# Patient Record
Sex: Female | Born: 1970 | Hispanic: No | Marital: Single | Smoking: Never smoker
Health system: Southern US, Community
[De-identification: ages and names within clinical notes are randomized; demographics above are authoritative.]

## PROBLEM LIST (undated history)

## (undated) DIAGNOSIS — I10 Essential (primary) hypertension: Secondary | ICD-10-CM

---

## 2014-08-09 ENCOUNTER — Emergency Department (INDEPENDENT_AMBULATORY_CARE_PROVIDER_SITE_OTHER)
Admission: EM | Admit: 2014-08-09 | Discharge: 2014-08-09 | Disposition: A | Discharge status: Home / Self Care | Payer: Self-pay | Source: Home / Self Care | Attending: Emergency Medicine | Admitting: Emergency Medicine

## 2014-08-09 ENCOUNTER — Encounter (HOSPITAL_COMMUNITY): Payer: Self-pay | Admitting: *Deleted

## 2014-08-09 DIAGNOSIS — N644 Mastodynia: Secondary | ICD-10-CM

## 2014-08-09 LAB — POCT PREGNANCY, URINE: Preg Test, Ur: NEGATIVE

## 2014-08-09 MED ORDER — IBUPROFEN 800 MG PO TABS
ORAL_TABLET | ORAL | Status: AC
  Filled 2014-08-09: qty 1

## 2014-08-09 MED ORDER — IBUPROFEN 800 MG PO TABS
800.0000 mg | ORAL_TABLET | Freq: Once | ORAL | Status: AC
  Administered 2014-08-09: 800 mg via ORAL

## 2014-08-09 NOTE — ED Notes (Signed)
Pt reports     Symptoms  Of     Pain  In  Both  Breasts           X  sev  Months     Pt  denys  Any  Drainage  From  Nipples  But  Reports  A  Sensation  That   She    Feels  Like  Pressure    Pt is  Sitting upright on  The  Exam table  Speaking in  Complete  sentances

## 2014-08-09 NOTE — Discharge Instructions (Signed)
Your exam is without worrisome finding. Advised using ibuprofen or tylenol as directed on packaging for discomfort and establishing care with local PCP for follow if symptoms persist. Ultimately patient would benefit from mammography as part of her routine medical care, however, her examination today does not suggest the need for urgent imaging.  Urine pregnancy test is negative

## 2014-08-09 NOTE — ED Provider Notes (Signed)
CSN: [id number]     Arrival date & time 08/09/14  1127 History   First MD Initiated Contact with Patient 08/09/14 1205     Chief Complaint  Patient presents with  . Breast Problem   (Consider location/radiation/quality/duration/timing/severity/associated sxs/prior Treatment) HPI Comments: Patient present for evaluation of a constant wandering burning sensation in both of her breasts over the past 2-3 months. Denies previous episodes. Has never had a mammogram. No changes in breast or skin appearance or nipple discharge. Has not tried taking any sort of medication for this issue. No reported injury. No SOB, chest pain, fever, dyspepsia. Nothing make symptoms better or worse. No rash.  Unemployed Nonsmoker LNMP: 07/19/2014 PCP: none  The history is provided by the patient.    History reviewed. No pertinent past medical history. Past Surgical History  Procedure Laterality Date  . Cesarean section     History reviewed. No pertinent family history. History  Substance Use Topics  . Smoking status: Never Smoker   . Smokeless tobacco: Not on file  . Alcohol Use: No   OB History    No data available     Review of Systems  All other systems reviewed and are negative.   Allergies  Review of patient's allergies indicates no known allergies.  Home Medications   Prior to Admission medications   Not on File   BP 137/71 mmHg  Pulse 70  Temp(Src) 99.1 F (37.3 C) (Oral)  Resp 18  SpO2 96%  LMP 07/19/2014 (Exact Date) Physical Exam  Constitutional: She is oriented to person, place, and time. She appears well-developed and well-nourished. No distress.  HENT:  Head: Normocephalic and atraumatic.  Cardiovascular: Normal rate, regular rhythm and normal heart sounds.   Pulmonary/Chest: Effort normal and breath sounds normal. Right breast exhibits inverted nipple and tenderness. Right breast exhibits no mass, no nipple discharge and no skin change. Left breast exhibits inverted nipple  and tenderness. Left breast exhibits no mass, no nipple discharge and no skin change. Breasts are symmetrical.    No axillary lymphadenopathy.   Genitourinary: There is breast tenderness. No breast swelling, discharge or bleeding.  Musculoskeletal: Normal range of motion.  Neurological: She is alert and oriented to person, place, and time.  Skin: Skin is warm and dry.  Psychiatric: She has a normal mood and affect. Her behavior is normal.  Nursing note and vitals reviewed.   ED Course  Procedures (including critical care time) Labs Review Labs Reviewed  POCT PREGNANCY, URINE    Imaging Review No results found.   MDM   1. Breast pain in female    Exam without worrisome finding. Advised using ibuprofen or tylenol as directed on packaging for discomfort and establishing care with local PCP for follow if symptoms persist. Ultimately patient would benefit from mammography as part of her routine medical care, however, her examination today does not suggest the need for urgent imaging.  UPT negative  Ria ClockJennifer Lee H Presson, [place]PA 08/09/14 1356

## 2018-10-06 ENCOUNTER — Telehealth: Payer: Self-pay | Admitting: Emergency Medicine

## 2018-10-06 ENCOUNTER — Other Ambulatory Visit: Payer: Self-pay

## 2018-10-06 ENCOUNTER — Encounter: Payer: Self-pay | Admitting: Emergency Medicine

## 2018-10-06 ENCOUNTER — Telehealth: Payer: Self-pay | Admitting: *Deleted

## 2018-10-06 NOTE — Telephone Encounter (Signed)
Called patient to triage for appointment.  Left message in mobile voice mail to call back. 

## 2018-10-06 NOTE — Telephone Encounter (Signed)
Called the patient a second time to triage for appointment. No answer, left message in mobile voice mail to call back.

## 2018-10-06 NOTE — Telephone Encounter (Signed)
10/06/2018 - PATIENT HAD A NEW PATIENT APPOINTMENT SCHEDULED WITH DR. Irving Shows ON Thursday (10/06/2018) AT 2:40pm. SHE WAS A NO SHOW. CYNTHIA SAID SHE NEEDS TO BE SCHEDULED FOR A COMPLETE PHYSICAL. I TRIED TO CALL AND MAKE APPOINTMENT BUT HAD TO LEAVE A MESSAGE ON HER VOICE MAIL TO RETURN OUR CALL. MBC

## 2018-12-13 ENCOUNTER — Encounter: Payer: Self-pay | Admitting: Emergency Medicine

## 2018-12-13 ENCOUNTER — Other Ambulatory Visit: Payer: Self-pay

## 2018-12-13 ENCOUNTER — Ambulatory Visit (INDEPENDENT_AMBULATORY_CARE_PROVIDER_SITE_OTHER): Payer: Self-pay | Admitting: Emergency Medicine

## 2018-12-13 VITALS — BP 150/82 | HR 79 | Temp 98.3°F | Resp 16 | Ht 61.0 in | Wt 186.6 lb

## 2018-12-13 DIAGNOSIS — M159 Polyosteoarthritis, unspecified: Secondary | ICD-10-CM

## 2018-12-13 MED ORDER — MELOXICAM 7.5 MG PO TABS: 7.5000 mg | ORAL_TABLET | Freq: Every day | ORAL | 3 refills | Status: AC

## 2018-12-13 NOTE — Progress Notes (Signed)
Evelyn Parker 48 y.o.   Chief Complaint  Patient presents with   Hand Pain    both since March   Knee Pain    both since March and swelling    HISTORY OF PRESENT ILLNESS: This is a 48 y.o. female complaining of pain to both knees and both hands for the past several weeks.  Denies injuries.  No other significant symptoms.  HPI   Prior to Admission medications   Medication Sig Start Date End Date Taking? Authorizing Provider  OVER THE COUNTER MEDICATION 2 (two) times a day.   Yes [provider]    No Known Allergies  There are no active problems to display for this patient.   History reviewed. No pertinent past medical history.  Past Surgical History:  Procedure Laterality Date   CESAREAN SECTION      Social History   Socioeconomic History   Marital status: Single    Spouse name: Not on file   Number of children: Not on file   Years of education: Not on file   Highest education level: Not on file  Occupational History   Not on file  Social Needs   Financial resource strain: Not on file   Food insecurity    Worry: Not on file    Inability: Not on file   Transportation needs    Medical: Not on file    Non-medical: Not on file  Tobacco Use   Smoking status: Never Smoker   Smokeless tobacco: Never Used  Substance and Sexual Activity   Alcohol use: No   Drug use: Never   Sexual activity: Not on file  Lifestyle   Physical activity    Days per week: Not on file    Minutes per session: Not on file   Stress: Not on file  Relationships   Social connections    Talks on phone: Not on file    Gets together: Not on file    Attends religious service: Not on file    Active member of club or organization: Not on file    Attends meetings of clubs or organizations: Not on file    Relationship status: Not on file   Intimate partner violence    Fear of current or ex partner: Not on file    Emotionally abused: Not on file   Physically abused: Not on file    Forced sexual activity: Not on file  Other Topics Concern   Not on file  Social History Narrative   Not on file    History reviewed. No pertinent family history.   Review of Systems  Constitutional: Negative.  Negative for chills and fever.  HENT: Negative.  Negative for sore throat.   Respiratory: Negative.  Negative for cough and shortness of breath.   Cardiovascular: Negative.  Negative for chest pain and palpitations.  Gastrointestinal: Negative.  Negative for abdominal pain, nausea and vomiting.  Genitourinary: Negative.   Musculoskeletal: Positive for joint pain.  Skin: Negative.  Negative for rash.  Neurological: Negative for dizziness, sensory change, focal weakness and headaches.  All other systems reviewed and are negative.  Vitals:   12/13/18 1659  BP: (!) 150/82  Pulse: 79  Resp: 16  Temp: 98.3 F (36.8 C)  SpO2: 95%     Physical Exam Vitals signs reviewed.  Constitutional:      Appearance: Normal appearance.  HENT:     Head: Normocephalic and atraumatic.  Eyes:     Extraocular Movements: Extraocular  movements intact.     Pupils: Pupils are equal, round, and reactive to light.  Neck:     Musculoskeletal: Normal range of motion and neck supple.  Cardiovascular:     Rate and Rhythm: Normal rate and regular rhythm.     Heart sounds: Normal heart sounds.  Pulmonary:     Effort: Pulmonary effort is normal.     Breath sounds: Normal breath sounds.  Musculoskeletal: Normal range of motion.        General: No deformity.     Right lower leg: No edema.     Left lower leg: No edema.     Comments: Knees: No bruising or erythema.  Mild swelling.  Painful range of motion.  Stable in flexion and extension. Hands: No bruising or erythema.  Mild swelling of finger joints.  Painful range of motion.  Skin:    General: Skin is warm and dry.     Capillary Refill: Capillary refill takes less than 2 seconds.  Neurological:      General: No focal deficit present.     Mental Status: She is alert and oriented to person, place, and time.  Psychiatric:        Mood and Affect: Mood normal.        Behavior: Behavior normal.      ASSESSMENT & PLAN: Evelyn Parker was seen today for hand pain and knee pain.  Diagnoses and all orders for this visit:  Osteoarthritis of multiple joints, unspecified osteoarthritis type -     meloxicam (MOBIC) 7.5 MG tablet; Take 1 tablet (7.5 mg total) by mouth daily.    Patient Instructions       Artrosis Osteoarthritis  La artrosis es un tipo de artritis que afecta el tejido que cubre los extremos de los huesos en las articulaciones (cartlago). El cartlago acta como amortiguador AGCO Corporationentre los huesos y los ayuda a moverse con suavidad. La artrosis se produce cuando el cartlago de las articulaciones se gasta. A veces, la artrosis se denomina artritis "por uso y desgaste". La artrosis es la forma ms frecuente de artritis. A menudo, afecta a las Smith Internationalpersonas mayores. Es una enfermedad que empeora con el tiempo (una enfermedad progresiva). Esta enfermedad afecta con ms frecuencia las articulaciones de:  Los dedos de Washington Mutuallas manos.  Los dedos Avayade los pies.  La cadera.  Las rodillas.  La columna vertebral, incluido el cuello y la zona lumbar. Cules son las causas? El desgaste del cartlago que cubre los extremos de los huesos relacionado con la edad, causa esta afeccin. Qu incrementa el riesgo? Los siguientes factores pueden hacer que usted sea ms propenso a Warehouse managertener esta afeccin:  Edad avanzada.  Tener exceso de Fairhavenpeso u obesidad.  Uso excesivo de las articulaciones, como en el caso de los Westportatletas.  Lesin pasada de Risk analystuna articulacin.  Ciruga pasada en una articulacin.  Antecedentes familiares de artrosis. Cules son los signos o los sntomas? Los principales sntomas de esta enfermedad son dolor, hinchazn y Geophysical data processorrigidez en la articulacin. Con el tiempo, la articulacin puede  perder su forma. Pequeos trozos de Dow Chemicalhueso o Patent examinercartlago se pueden desprender y flotar dentro de la articulacin, lo cual puede causar ms dolor y Clinical cytogeneticistdao en la articulacin. Pueden formarse pequeos depsitos de hueso (ostefitos) en los extremos de Nurse, learning disabilityla articulacin. Otros sntomas pueden incluir lo siguiente:  Una sensacin de chirrido o raspado dentro de la articulacin al moverla.  Sonidos de chasquido o crujido al Clorox Companymoverse. Los sntomas pueden afectar una o ms articulaciones. La  artrosis en Risk analystuna articulacin principal, como la rodilla o la cadera, puede causar dolor al caminar o al realizar ejercicio. Si tiene artrosis Jabil Circuiten las manos, es posible que no pueda agarrar objetos, torcer la mano o controlar pequeos movimientos de las manos y los dedos (motricidad fina). Cmo se diagnostica? Esta afeccin se puede diagnosticar en funcin de lo siguiente:  Sus antecedentes mdicos.  Un examen fsico.  Sus sntomas.  Radiografas de la(s) articulacin(es) afectada(s).  Anlisis de sangre para descartar otros tipos de artritis. Cmo se trata? No hay cura para esta enfermedad, pero el tratamiento puede ayudar a Human resources officercontrolar el dolor y Scientist, clinical (histocompatibility and immunogenetics)mejorar el funcionamiento de Nurse, learning disabilityla articulacin. Los planes de tratamiento pueden incluir:  Un programa de ejercicio indicado que permita el descanso y el alivio de la articulacin. Puede trabajar con un fisioterapeuta.  Un plan de control del peso.  Tcnicas de Occidental Petroleumalivio del dolor, como: ? Aplicacin de calor y fro en la articulacin. ? Impulsos elctricos aplicados a las terminaciones nerviosas que se encuentran debajo de la piel (estimulacin nerviosa elctrica transcutnea o TENS). ? Masajes. ? Ciertos suplementos nutricionales.  Antiinflamatorios no esteroideos (AINE) o medicamentos recetados para ayudar a Engineer, materialsaliviar el dolor.  Medicamentos para ayudar a Engineer, materialsaliviar el dolor y la inflamacin (corticoesteroides). Estos se pueden administrar por boca (va oral) o mediante una  inyeccin.  Dispositivos de [place] Vincent and the Grenadinesayuda, como un dispositivo ortopdico, una frula, un guante especial o un bastn.  Ciruga, como: ? Carolin SicksUna osteotoma. Se hace para volver a posicionar los TransMontaignehuesos y Engineer, materialsaliviar el dolor o para Oceanographerretirar los trozos sueltos de hueso y TEFL teachercartlago. ? Ciruga de reemplazo articular. Es posible que necesite esta ciruga si tiene una artrosis muy grave (avanzada). Siga estas indicaciones en su casa: Actividad  Descanse las articulaciones afectadas segn las indicaciones del mdico.  No conduzca ni use maquinaria pesada mientras toma analgsicos recetados.  Haga ejercicio segn le indiquen. Es posible que el mdico o el fisioterapeuta le recomienden tipos especficos de ejercicio, tales como: ? Ejercicios de fortalecimiento. Se realizan para fortalecer los msculos que sostienen las articulaciones afectadas por la artritis. Pueden realizarse con peso o con bandas para agregar resistencia. ? Ejercicios aerbicos. Son ejercicios, como caminar a paso ligero o hacer gimnasia Cook Islandsaerbica acutica, que aumentan la actividad del corazn. ? Actividades de amplitud de movimientos. Facilitan el movimiento de las articulaciones. ? Ejercicios de equilibrio y Russian Federationagilidad. Control del dolor, la rigidez y la hinchazn      Si se lo indican, aplique calor en la zona afectada con la frecuencia que le haya indicado el mdico. Use la fuente de calor que el mdico le recomiende, como una compresa de calor hmedo o una almohadilla trmica. ? Si tiene un dispositivo de ayuda que se puede quitar, quteselo segn lo indicado por su mdico. ? Coloque una toalla entre la piel y la fuente de Airline pilotcalor. Si el mdico le indica que no se quite el dispositivo de USAAayuda mientras se Tax inspectoraplica calor, coloque una toalla entre el dispositivo de Saint Vincent and the Grenadinesayuda y la fuente de Airline pilotcalor. ? Aplique el calor durante 20 a 30minutos. ? Retire la fuente de calor si la piel se pone de color rojo brillante. Esto es muy importante si no puede Chief of Staffsentir  dolor, calor o fro. Puede correr un riesgo mayor de sufrir quemaduras.  Si se lo indican, aplique hielo sobre la articulacin afectada: ? Si tiene un dispositivo de ayuda que se puede quitar, quteselo segn lo indicado por su mdico. ? Ponga el hielo en Lucile Shuttersuna bolsa  plstica. ? Coloque una FirstEnergy Corptoalla entre la piel y la bolsa de hielo. Si el mdico le indica que no se quite el dispositivo de USAAayuda mientras se aplica hielo, coloque una toalla entre el dispositivo de [place] Vincent and the Grenadinesayuda y la bolsa de hielo. ? Coloque el hielo durante 20minutos, 2 a 3veces por da. Instrucciones generales  Baxter Internationalome los medicamentos de venta libre y los recetados solamente como se lo haya indicado el mdico.  Mantenga un peso saludable. Siga las instrucciones de su mdico con respecto al control de su peso. Estas pueden incluir restricciones en la dieta.  No consuma ningn producto que contenga nicotina o tabaco, como cigarrillos y Administrator, Civil Servicecigarrillos electrnicos. Estos pueden retrasar la consolidacin del Twin Fallshueso. Si necesita ayuda para dejar de fumar, consulte al American Expressmdico.  Use los dispositivos de American Expressayuda como se lo haya indicado el mdico.  Concurra a todas las visitas de seguimiento como se lo haya indicado el mdico. Esto es importante. Dnde encontrar ms informacin:  Training and development officernstituto Nacional de Reumatismo Articular y Event organisernfermedades Musculoesquelticas y ArboriculturistDermatolgicas [facility] Hlthcare(National Institute of Arthritis and Musculoskeletal and Skin Diseases): www.niams.http://www.myers.net/nih.gov  Instituto Lockheed Martinacional sobre el Envejecimiento (General Millsational Institute on Aging): https://walker.com/www.nia.nih.gov  Instituto Estadounidense de Advice workereumatologa (American College of Rheumatology): www.rheumatology.org Comunquese con un mdico si:  Su piel se pone roja.  Presenta una erupcin cutnea.  Siente un dolor que [place] Rapidsempeora.  Tiene fiebre y siente dolor en la articulacin o el msculo. Solicite ayuda de inmediato si:  Northeast UtilitiesPierde mucho peso.  Pierde el apetito repentinamente.  Transpira durante la  noche. Resumen  La artrosis es una clase de artritis que afecta el tejido que cubre los extremos de los huesos en las articulaciones (cartlago).  El desgaste del cartlago que cubre los extremos de los huesos relacionado con la edad, causa esta afeccin.  Los sntomas principales de esta enfermedad son dolor, hinchazn y Geophysical data processorrigidez en la articulacin.  No hay cura para esta enfermedad, pero el tratamiento puede ayudar a Human resources officercontrolar el dolor y Scientist, clinical (histocompatibility and immunogenetics)mejorar el funcionamiento de Nurse, learning disabilityla articulacin. Esta informacin no tiene Theme park managercomo fin reemplazar el consejo del mdico. Asegrese de hacerle al mdico cualquier pregunta que tenga. Document Released: 03/04/2005 Document Revised: 08/23/2017 Document Reviewed: 01/30/2013 Elsevier Patient Education  [employer]2020 Elsevier Inc.  If you have lab work done today you will be contacted with your lab results within the next 2 weeks.  If you have not heard from us then please contact us. The fastest way to get your results is to register for My Chart.   IF you received an x-ray today, you will receive an invoice from [facility]. Please contact [facility] at [phone] with questions or concerns regarding your invoice.   IF you received labwork today, you will receive an invoice from [facility]. Please contact [facility] at [phone] with questions or concerns regarding your invoice.   Our billing staff will not be able to assist you with questions regarding bills from these companies.  You will be contacted with the lab results as soon as they are available. The fastest way to get your results is to activate your My Chart account. Instructions are located on the last page of this paperwork. If you have not heard from us regarding the results in 2 weeks, please contact this office.         Edwina BarthMiguel Zeba Luby, MD [facility]

## 2018-12-13 NOTE — Patient Instructions (Addendum)
Artrosis Osteoarthritis  La artrosis es un tipo de artritis que afecta el tejido que cubre los extremos de los huesos en las articulaciones (cartlago). El cartlago acta como amortiguador AGCO Corporationentre los huesos y los ayuda a moverse con suavidad. La artrosis se produce cuando el cartlago de las articulaciones se gasta. A veces, la artrosis se denomina artritis "por uso y desgaste". La artrosis es la forma ms frecuente de artritis. A menudo, afecta a las Smith Internationalpersonas mayores. Es una enfermedad que empeora con el tiempo (una enfermedad progresiva). Esta enfermedad afecta con ms frecuencia las articulaciones de:  Los dedos de Washington Mutuallas manos.  Los dedos Avayade los pies.  La cadera.  Las rodillas.  La columna vertebral, incluido el cuello y la zona lumbar. Cules son las causas? El desgaste del cartlago que cubre los extremos de los huesos relacionado con la edad, causa esta afeccin. Qu incrementa el riesgo? Los siguientes factores pueden hacer que usted sea ms propenso a Warehouse managertener esta afeccin:  Edad avanzada.  Tener exceso de Glendorapeso u obesidad.  Uso excesivo de las articulaciones, como en el caso de los Eustisatletas.  Lesin pasada de Risk analystuna articulacin.  Ciruga pasada en una articulacin.  Antecedentes familiares de artrosis. Cules son los signos o los sntomas? Los principales sntomas de esta enfermedad son dolor, hinchazn y Geophysical data processorrigidez en la articulacin. Con el tiempo, la articulacin puede perder su forma. Pequeos trozos de Dow Chemicalhueso o Patent examinercartlago se pueden desprender y flotar dentro de la articulacin, lo cual puede causar ms dolor y Clinical cytogeneticistdao en la articulacin. Pueden formarse pequeos depsitos de hueso (ostefitos) en los extremos de Nurse, learning disabilityla articulacin. Otros sntomas pueden incluir lo siguiente:  Una sensacin de chirrido o raspado dentro de la articulacin al moverla.  Sonidos de chasquido o crujido al Clorox Companymoverse. Los sntomas pueden afectar una o ms articulaciones. La artrosis en una  articulacin principal, como la rodilla o la cadera, puede causar dolor al caminar o al realizar ejercicio. Si tiene artrosis Jabil Circuiten las manos, es posible que no pueda agarrar objetos, torcer la mano o controlar pequeos movimientos de las manos y los dedos (motricidad fina). Cmo se diagnostica? Esta afeccin se puede diagnosticar en funcin de lo siguiente:  Sus antecedentes mdicos.  Un examen fsico.  Sus sntomas.  Radiografas de la(s) articulacin(es) afectada(s).  Anlisis de sangre para descartar otros tipos de artritis. Cmo se trata? No hay cura para esta enfermedad, pero el tratamiento puede ayudar a Human resources officercontrolar el dolor y Scientist, clinical (histocompatibility and immunogenetics)mejorar el funcionamiento de Nurse, learning disabilityla articulacin. Los planes de tratamiento pueden incluir:  Un programa de ejercicio indicado que permita el descanso y el alivio de la articulacin. Puede trabajar con un fisioterapeuta.  Un plan de control del peso.  Tcnicas de Occidental Petroleumalivio del dolor, como: ? Aplicacin de calor y fro en la articulacin. ? Impulsos elctricos aplicados a las terminaciones nerviosas que se encuentran debajo de la piel (estimulacin nerviosa elctrica transcutnea o TENS). ? Masajes. ? Ciertos suplementos nutricionales.  Antiinflamatorios no esteroideos (AINE) o medicamentos recetados para ayudar a Engineer, materialsaliviar el dolor.  Medicamentos para ayudar a Engineer, materialsaliviar el dolor y la inflamacin (corticoesteroides). Estos se pueden administrar por boca (va oral) o mediante una inyeccin.  Dispositivos de [place] Vincent and the Grenadinesayuda, como un dispositivo ortopdico, una frula, un guante especial o un bastn.  Ciruga, como: ? Carolin SicksUna osteotoma. Se hace para volver a posicionar los TransMontaignehuesos y Engineer, materialsaliviar el dolor o para Oceanographerretirar los trozos sueltos de hueso y TEFL teachercartlago. ? Ciruga de reemplazo articular. Es posible que necesite Saint Vincent and the Grenadinesesta ciruga  si tiene una artrosis muy grave (avanzada). Siga estas indicaciones en su casa: Actividad  Descanse las articulaciones afectadas segn las indicaciones del  mdico.  No conduzca ni use maquinaria pesada mientras toma analgsicos recetados.  Haga ejercicio segn le indiquen. Es posible que el mdico o el fisioterapeuta le recomienden tipos especficos de ejercicio, tales como: ? Ejercicios de fortalecimiento. Se realizan para fortalecer los msculos que sostienen las articulaciones afectadas por la artritis. Pueden realizarse con peso o con bandas para agregar resistencia. ? Ejercicios aerbicos. Son ejercicios, como caminar a paso ligero o hacer gimnasia Cook Islandsaerbica acutica, que aumentan la actividad del corazn. ? Actividades de amplitud de movimientos. Facilitan el movimiento de las articulaciones. ? Ejercicios de equilibrio y Russian Federationagilidad. Control del dolor, la rigidez y la hinchazn      Si se lo indican, aplique calor en la zona afectada con la frecuencia que le haya indicado el mdico. Use la fuente de calor que el mdico le recomiende, como una compresa de calor hmedo o una almohadilla trmica. ? Si tiene un dispositivo de ayuda que se puede quitar, quteselo segn lo indicado por su mdico. ? Coloque una toalla entre la piel y la fuente de Airline pilotcalor. Si el mdico le indica que no se quite el dispositivo de USAAayuda mientras se Tax inspectoraplica calor, coloque una toalla entre el dispositivo de [place] Vincent and the Grenadinesayuda y la fuente de Airline pilotcalor. ? Aplique el calor durante 20 a 30minutos. ? Retire la fuente de calor si la piel se pone de color rojo brillante. Esto es muy importante si no puede Financial risk analystsentir dolor, calor o fro. Puede correr un riesgo mayor de sufrir quemaduras.  Si se lo indican, aplique hielo sobre la articulacin afectada: ? Si tiene un dispositivo de ayuda que se puede quitar, quteselo segn lo indicado por su mdico. ? Ponga el hielo en una bolsa plstica. ? Coloque una FirstEnergy Corptoalla entre la piel y la bolsa de hielo. Si el mdico le indica que no se quite el dispositivo de USAAayuda mientras se aplica hielo, coloque una toalla entre el dispositivo de [place] Vincent and the Grenadinesayuda y la bolsa de  hielo. ? Coloque el hielo durante 20minutos, 2 a 3veces por da. Instrucciones generales  Baxter Internationalome los medicamentos de venta libre y los recetados solamente como se lo haya indicado el mdico.  Mantenga un peso saludable. Siga las instrucciones de su mdico con respecto al control de su peso. Estas pueden incluir restricciones en la dieta.  No consuma ningn producto que contenga nicotina o tabaco, como cigarrillos y Administrator, Civil Servicecigarrillos electrnicos. Estos pueden retrasar la consolidacin del Uraniahueso. Si necesita ayuda para dejar de fumar, consulte al American Expressmdico.  Use los dispositivos de American Expressayuda como se lo haya indicado el mdico.  Concurra a todas las visitas de seguimiento como se lo haya indicado el mdico. Esto es importante. Dnde encontrar ms informacin:  Training and development officernstituto Nacional de Reumatismo Articular y Event organisernfermedades Musculoesquelticas y ArboriculturistDermatolgicas [facility](National Institute of Arthritis and Musculoskeletal and Skin Diseases): www.niams.http://www.myers.net/nih.gov  Instituto Lockheed Martinacional sobre el Envejecimiento (General Millsational Institute on Aging): https://walker.com/www.nia.nih.gov  Instituto Estadounidense de Advice workereumatologa (American College of Rheumatology): www.rheumatology.org Comunquese con un mdico si:  Su piel se pone roja.  Presenta una erupcin cutnea.  Siente un dolor que Fenwickempeora.  Tiene fiebre y siente dolor en la articulacin o el msculo. Solicite ayuda de inmediato si:  Northeast UtilitiesPierde mucho peso.  Pierde el apetito repentinamente.  Transpira durante la noche. Resumen  La artrosis es una clase de artritis que afecta el tejido que cubre los extremos de los Lucent Technologieshuesos en las  articulaciones (cartlago).  El desgaste del cartlago que cubre los extremos de los huesos relacionado con la edad, causa esta afeccin.  Los sntomas principales de esta enfermedad son dolor, hinchazn y Advertising account executive.  No hay cura para esta enfermedad, pero el tratamiento puede ayudar a Financial controller y Teacher, English as a foreign language el funcionamiento de Museum/gallery exhibitions officer. Esta informacin no tiene Marine scientist el consejo del mdico. Asegrese de hacerle al mdico cualquier pregunta que tenga. Document Released: 03/04/2005 Document Revised: 08/23/2017 Document Reviewed: 01/30/2013 Elsevier Patient Education  [employer].  If you have lab work done today you will be contacted with your lab results within the next 2 weeks.  If you have not heard from [place] then please contact us. The fastest way to get your results is to register for My Chart.   IF you received an x-ray today, you will receive an invoice from [facility]. Please contact [facility] at [phone] with questions or concerns regarding your invoice.   IF you received labwork today, you will receive an invoice from [place]. Please contact [facility] at [phone] with questions or concerns regarding your invoice.   Our billing staff will not be able to assist you with questions regarding bills from these companies.  You will be contacted with the lab results as soon as they are available. The fastest way to get your results is to activate your My Chart account. Instructions are located on the last page of this paperwork. If you have not heard from [place] regarding the results in 2 weeks, please contact this office.

## 2020-02-24 ENCOUNTER — Emergency Department (HOSPITAL_BASED_OUTPATIENT_CLINIC_OR_DEPARTMENT_OTHER): Payer: Self-pay

## 2020-02-24 ENCOUNTER — Encounter (HOSPITAL_BASED_OUTPATIENT_CLINIC_OR_DEPARTMENT_OTHER): Payer: Self-pay | Admitting: Emergency Medicine

## 2020-02-24 ENCOUNTER — Emergency Department (HOSPITAL_BASED_OUTPATIENT_CLINIC_OR_DEPARTMENT_OTHER)
Admission: EM | Admit: 2020-02-24 | Discharge: 2020-02-24 | Disposition: A | Discharge status: Home / Self Care | Payer: Self-pay | Attending: Emergency Medicine | Admitting: Emergency Medicine

## 2020-02-24 ENCOUNTER — Other Ambulatory Visit: Payer: Self-pay

## 2020-02-24 DIAGNOSIS — I10 Essential (primary) hypertension: Secondary | ICD-10-CM | POA: Insufficient documentation

## 2020-02-24 DIAGNOSIS — R42 Dizziness and giddiness: Secondary | ICD-10-CM | POA: Insufficient documentation

## 2020-02-24 DIAGNOSIS — R110 Nausea: Secondary | ICD-10-CM | POA: Insufficient documentation

## 2020-02-24 DIAGNOSIS — R55 Syncope and collapse: Secondary | ICD-10-CM | POA: Insufficient documentation

## 2020-02-24 DIAGNOSIS — R072 Precordial pain: Secondary | ICD-10-CM | POA: Insufficient documentation

## 2020-02-24 DIAGNOSIS — K760 Fatty (change of) liver, not elsewhere classified: Secondary | ICD-10-CM | POA: Insufficient documentation

## 2020-02-24 DIAGNOSIS — E876 Hypokalemia: Secondary | ICD-10-CM | POA: Insufficient documentation

## 2020-02-24 HISTORY — DX: Essential (primary) hypertension: I10

## 2020-02-24 LAB — CBC
HCT: 38.1 % (ref 36.0–46.0)
Hemoglobin: 12.3 g/dL (ref 12.0–15.0)
MCH: 28.3 pg (ref 26.0–34.0)
MCHC: 32.3 g/dL (ref 30.0–36.0)
MCV: 87.8 fL (ref 80.0–100.0)
Platelets: 280 10*3/uL (ref 150–400)
RBC: 4.34 MIL/uL (ref 3.87–5.11)
RDW: 12.6 % (ref 11.5–15.5)
WBC: 9.6 10*3/uL (ref 4.0–10.5)
nRBC: 0 % (ref 0.0–0.2)

## 2020-02-24 LAB — BASIC METABOLIC PANEL
Anion gap: 11 (ref 5–15)
BUN: 14 mg/dL (ref 6–20)
CO2: 23 mmol/L (ref 22–32)
Calcium: 8.5 mg/dL — ABNORMAL LOW (ref 8.9–10.3)
Chloride: 102 mmol/L (ref 98–111)
Creatinine, Ser: 0.72 mg/dL (ref 0.44–1.00)
GFR calc Af Amer: >60 mL/min (ref >60)
GFR calc non Af Amer: >60 mL/min (ref >60)
Glucose, Bld: 118 mg/dL — ABNORMAL HIGH (ref 70–99)
Potassium: 3.1 mmol/L — ABNORMAL LOW (ref 3.5–5.1)
Sodium: 136 mmol/L (ref 135–145)

## 2020-02-24 LAB — TROPONIN I (HIGH SENSITIVITY)
Troponin I (High Sensitivity): 2 ng/L (ref <18)
Troponin I (High Sensitivity): 3 ng/L (ref <18)

## 2020-02-24 LAB — D-DIMER, QUANTITATIVE: D-Dimer, Quant: 0.52 ug/mL-FEU — ABNORMAL HIGH (ref 0.00–0.50)

## 2020-02-24 LAB — PREGNANCY, URINE: Preg Test, Ur: NEGATIVE

## 2020-02-24 MED ORDER — POTASSIUM CHLORIDE CRYS ER 20 MEQ PO TBCR
40.0000 meq | EXTENDED_RELEASE_TABLET | Freq: Once | ORAL | Status: AC
  Administered 2020-02-24: 40 meq via ORAL
  Filled 2020-02-24: qty 2

## 2020-02-24 MED ORDER — IOHEXOL 350 MG/ML SOLN
100.0000 mL | Freq: Once | INTRAVENOUS | Status: AC | PRN
  Administered 2020-02-24: 100 mL via INTRAVENOUS

## 2020-02-24 MED ORDER — FAMOTIDINE 20 MG PO TABS: 20.0000 mg | ORAL_TABLET | Freq: Two times a day (BID) | ORAL | 0 refills | Status: AC

## 2020-02-24 NOTE — ED Triage Notes (Signed)
Pt with her daughter who is translating. Pt c/o R side chest pain since 4pm. Also reports she had a syncopal episode.

## 2020-02-24 NOTE — ED Notes (Signed)
Presents with sharp non-radiating chest pain, had nausea and vomiting when pain occurred immediately after eating. Placed on cont cardiac monitoring with cont POX monitoirng and int NBP assessments, ED PA at bedside interviewing client at this time. Has been told she has HTN, no diabetes, non-smoker. States chest pain is now going away with lying down. Denies diarrhea. Orders rec

## 2020-02-24 NOTE — ED Notes (Signed)
Patient transported to CT, via stretcher, SR x 2 up

## 2020-02-24 NOTE — ED Notes (Signed)
Patient transported to X-ray via w/c 

## 2020-02-24 NOTE — Discharge Instructions (Signed)
Please read and follow all provided instructions.  Your diagnoses today include:  1. Precordial chest pain   2. Hypokalemia   3. Hepatic steatosis     Tests performed today include:  An EKG of your heart  A chest x-ray  Cardiac enzymes - a blood test for heart muscle damage  Blood counts and electrolytes  CT scan to look for blood clots - no problems seen in the chest or lungs, you have build-up of fat that we can see in the liver, please let your doctor know about this  Vital signs. See below for your results today.   Medications prescribed:   Pepcid (famotidine) - antihistamine  You can find this medication over-the-counter.   DO NOT exceed:   20mg  Pepcid every 12 hours  Take any prescribed medications only as directed.  Follow-up instructions: Please follow-up with your primary care provider in the next three days for recheck.   Return instructions:  SEEK IMMEDIATE MEDICAL ATTENTION IF:  You have severe chest pain, especially if the pain is crushing or pressure-like and spreads to the arms, back, neck, or jaw, or if you have sweating, nausea (feeling sick to your stomach), or shortness of breath. THIS IS AN EMERGENCY. Don't wait to see if the pain will go away. Get medical help at once. Call 911 or 0 (operator). DO NOT drive yourself to the hospital.   Your chest pain gets worse and does not go away with rest.   You have an attack of chest pain lasting longer than usual, despite rest and treatment with the medications your caregiver has prescribed.   You wake from sleep with chest pain or shortness of breath.  You feel dizzy or faint.  You have chest pain not typical of your usual pain for which you originally saw your caregiver.   You have any other emergent concerns regarding your health.  Additional Information: Chest pain comes from many different causes. Your caregiver has diagnosed you as having chest pain that is not specific for one problem, but does  not require admission.  You are at low risk for an acute heart condition or other serious illness.   Your vital signs today were: BP 139/78 (BP Location: Left Arm)   Pulse 66   Temp 98.2 F (36.8 C) (Oral)   Resp (!) 22   Ht 5\' 1"  (1.549 m)   Wt 77.1 kg   LMP 02/24/2020 (Exact Date)   SpO2 98%   BMI 32.12 kg/m  If your blood pressure (BP) was elevated above 135/85 this visit, please have this repeated by your doctor within one month. --------------

## 2020-02-24 NOTE — ED Provider Notes (Signed)
[facility] EMERGENCY DEPARTMENT Provider Note   CSN: [id number] Arrival date & time: 02/24/20  1755     History Chief Complaint  Patient presents with  . Chest Pain    Evelyn Parker is a 49 y.o. female.  Patient with history of hypertension and high cholesterol presents the emergency department for acute onset of chest pain at approximately 4 PM.  Patient was eating and developed sharp pain in middle of her chest with associated nausea and near vomiting.  Pain was severe at onset.  It did not radiate.  She had a near syncopal episode and has been "zoning in and out" per patient's daughter who is at bedside interpreting.  No reported fevers, cough, shortness of breath.  No preceding vomiting or diarrhea.  No treatments prior to arrival.  Patient denies history of diabetes, tobacco abuse, family history of heart attacks.  Patient denies any current lower extremity swelling. Patient denies risk factors for pulmonary embolism including: unilateral leg swelling, history of DVT/PE/other blood clots, use of exogenous hormones, recent immobilizations, recent surgery, recent travel (>4hr segment), malignancy, hemoptysis.         Past Medical History:  Diagnosis Date  . Hypertension     There are no problems to display for this patient.   Past Surgical History:  Procedure Laterality Date  . CESAREAN SECTION       OB History   No obstetric history on file.     No family history on file.  Social History   Tobacco Use  . Smoking status: Never Smoker  . Smokeless tobacco: Never Used  Substance Use Topics  . Alcohol use: No  . Drug use: Never    Home Medications Prior to Admission medications   Medication Sig Start Date End Date Taking? Authorizing Provider  meloxicam (MOBIC) 7.5 MG tablet Take 1 tablet (7.5 mg total) by mouth daily. 12/13/18   Georgina Quint, MD  OVER THE COUNTER MEDICATION 2 (two) times a day.    [provider]     Allergies    Penicillins  Review of Systems   Review of Systems  Constitutional: Negative for diaphoresis and fever.  Eyes: Negative for redness.  Respiratory: Negative for cough and shortness of breath.   Cardiovascular: Positive for chest pain. Negative for palpitations and leg swelling.  Gastrointestinal: Positive for nausea. Negative for abdominal pain and vomiting.  Genitourinary: Negative for dysuria.  Musculoskeletal: Negative for back pain and neck pain.  Skin: Negative for rash.  Neurological: Positive for dizziness, syncope and light-headedness.  Psychiatric/Behavioral: The patient is not nervous/anxious.     Physical Exam Updated Vital Signs BP 140/72 (BP Location: Left Arm)   Pulse 78   Temp 98.2 F (36.8 C) (Oral)   Resp (!) 22   Ht 5\' 1"  (1.549 m)   Wt 77.1 kg   SpO2 98%   BMI 32.12 kg/m   Physical Exam Vitals and nursing note reviewed.  Constitutional:      Appearance: She is well-developed. She is not diaphoretic.  HENT:     Head: Normocephalic and atraumatic.     Mouth/Throat:     Mouth: Mucous membranes are not dry.  Eyes:     Conjunctiva/sclera: Conjunctivae normal.  Neck:     Vascular: Normal carotid pulses. No carotid bruit or JVD.     Trachea: Trachea normal. No tracheal deviation.  Cardiovascular:     Rate and Rhythm: Normal rate and regular rhythm.     Pulses:  No decreased pulses.     Heart sounds: Normal heart sounds, S1 normal and S2 normal. No murmur heard.   Pulmonary:     Effort: Pulmonary effort is normal. No respiratory distress.     Breath sounds: No wheezing.  Chest:     Chest wall: No tenderness.  Abdominal:     General: Bowel sounds are normal.     Palpations: Abdomen is soft.     Tenderness: There is no abdominal tenderness. There is no guarding or rebound.  Musculoskeletal:        General: Normal range of motion.     Cervical back: Normal range of motion and neck supple. No muscular tenderness.  Skin:    General:  Skin is warm and dry.     Coloration: Skin is not pale.  Neurological:     Mental Status: She is alert.  Psychiatric:        Mood and Affect: Mood is anxious.     ED Results / Procedures / Treatments   Labs (all labs ordered are listed, but only abnormal results are displayed) Labs Reviewed  BASIC METABOLIC PANEL - Abnormal; Notable for the following components:      Result Value   Potassium 3.1 (*)    Glucose, Bld 118 (*)    Calcium 8.5 (*)    All other components within normal limits  D-DIMER, QUANTITATIVE (NOT AT [facility]) - Abnormal; Notable for the following components:   D-Dimer, Quant 0.52 (*)    All other components within normal limits  CBC  PREGNANCY, URINE  TROPONIN I (HIGH SENSITIVITY)  TROPONIN I (HIGH SENSITIVITY)    EKG EKG Interpretation  Date/Time:  Saturday February 24 2020 18:10:24 EDT Ventricular Rate:  81 PR Interval:    QRS Duration: 91 QT Interval:  407 QTC Calculation: 473 R Axis:   80 Text Interpretation: Sinus rhythm No STEMI Confirmed by Alona Bene [phone]) on 02/24/2020 6:12:05 PM   Radiology DG Chest 2 View  Result Date: 02/24/2020 CLINICAL DATA:  Chest pain. EXAM: CHEST - 2 VIEW COMPARISON:  None. FINDINGS: The heart size and mediastinal contours are within normal limits. Both lungs are clear. The visualized skeletal structures are unremarkable. IMPRESSION: No active cardiopulmonary disease. Electronically Signed   By: Gerome Sam III M.D   On: 02/24/2020 18:55    Procedures Procedures (including critical care time)  Medications Ordered in ED Medications  potassium chloride SA (KLOR-CON) CR tablet 40 mEq (40 mEq Oral Given 02/24/20 1903)  iohexol (OMNIPAQUE) 350 MG/ML injection 100 mL (100 mLs Intravenous Contrast Given 02/24/20 2000)    ED Course  I have reviewed the triage vital signs and the nursing notes.  Pertinent labs & imaging results that were available during my care of the patient were reviewed by me and considered in  my medical decision making (see chart for details).  Patient seen and examined. Work-up initiated.  Given reported severe pain and near syncope, will check a D-dimer and orthostatics.  Patient looks comfortable at time of exam.  Vital signs reviewed and are as follows: BP 140/72 (BP Location: Left Arm)   Pulse 78   Temp 98.2 F (36.8 C) (Oral)   Resp (!) 22   Ht 5\' 1"  (1.549 m)   Wt 77.1 kg   SpO2 98%   BMI 32.12 kg/m   7:32 PM D-dimer slightly elevated. Pt/daughter updated. CT ordered. Will replete K.   10:18 PM CT negative. Pt's pain is resolved. 2nd trop was negative.  Patient and daughter updated.  They are comfortable discharged home.  We will discharged home on 7-day course of Pepcid.  Encourage PCP follow-up in the next 72 hours for recheck.  Patient was counseled to return with severe chest pain, especially if the pain is crushing or pressure-like and spreads to the arms, back, neck, or jaw, or if they have sweating, nausea, or shortness of breath with the pain. They were encouraged to call 911 with these symptoms.   The patient verbalized understanding and agreed.     MDM Rules/Calculators/A&P                          Chest pain: Sharp, occurred during eating, severe.  Patient with cardiac work-up due to risk factors.  This included troponins which were negative x2.  D-dimer which was elevated.  CT which did not demonstrate any problems or blood clots.  EKG is nonischemic.  No pneumonia noted.  Do not suspect dissection.  Symptoms improved.  Overall suspect GI etiology.  Will give course of Pepcid.  Patient encouraged to stick with bland foods.  Hypokalemia: Repleted.  Hepatic steatosis: Encouraged follow-up with PCP.  Incidentally found on CT imaging.    Final Clinical Impression(s) / ED Diagnoses Final diagnoses:  Precordial chest pain  Hypokalemia  Hepatic steatosis    Rx / DC Orders ED Discharge Orders         Ordered    famotidine (PEPCID) 20 MG tablet  2  times daily        02/24/20 2216           Renne Crigler, PA-C 02/24/20 2220    Maia Plan, MD 02/26/20 1624

## 2021-07-24 IMAGING — CT CT ANGIO CHEST
2 of 8 series · 19 of 36 positions shown · IV contrast (Omnipaque)
Comparison: None.

CLINICAL DATA: Chest pain, elevated D-dimer

EXAM:
CT ANGIOGRAPHY CHEST WITH CONTRAST
TECHNIQUE: Multidetector CT imaging of the chest was performed using the
standard protocol during bolus administration of intravenous
contrast. Multiplanar CT image reconstructions and MIPs were
obtained to evaluate the vascular anatomy.
CONTRAST:  100mL OMNIPAQUE IOHEXOL 350 MG/ML SOLN

[Series 6: pe coronal mpr · coronal · 0.55mm/px · 1 of 126 slices shown]
[im 63/126  mediastinal]
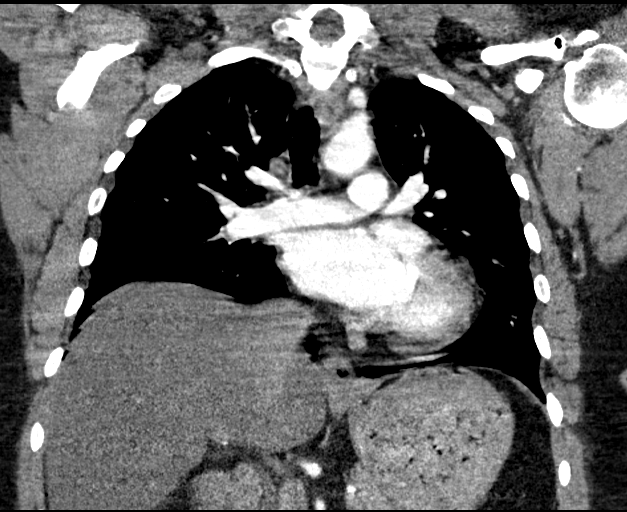

[Series 10: pe thins · axial · 0.73mm/px · z∈[+786,+1034]mm · 18 of 278 slices shown]
[im 15/278  lung]
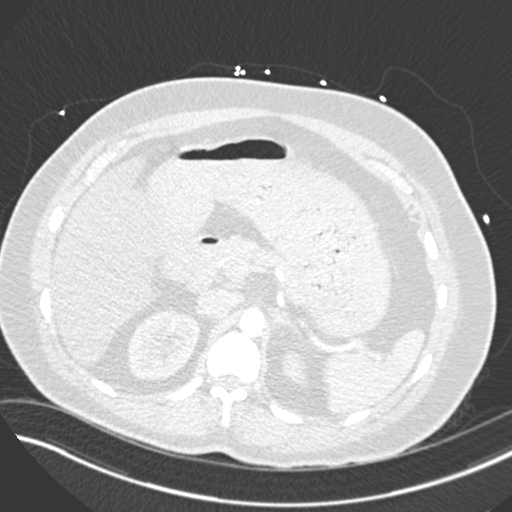
[im 30/278  mediastinal]
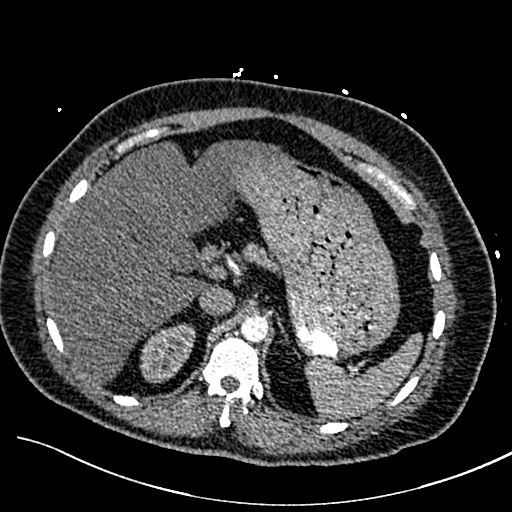
[im 44/278  lung]
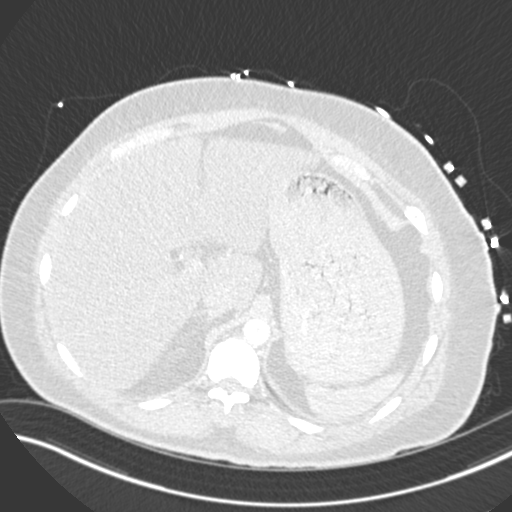
[im 59/278  mediastinal]
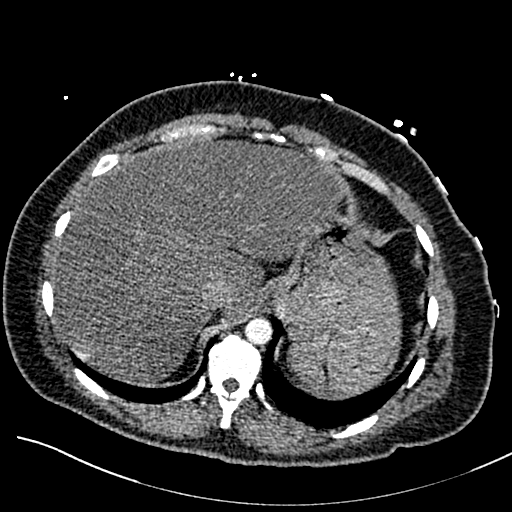
[im 73/278  lung]
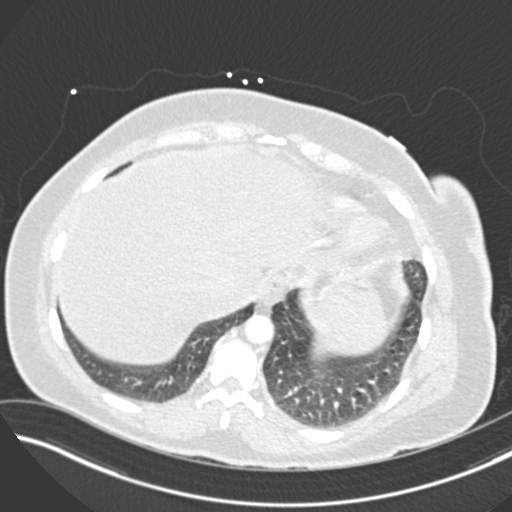
[im 88/278  mediastinal]
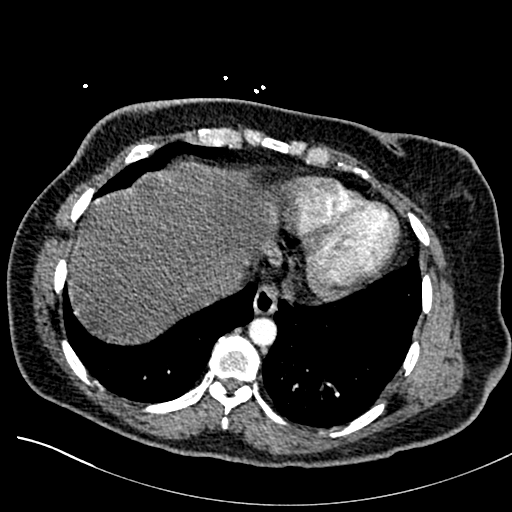
[im 103/278  lung]
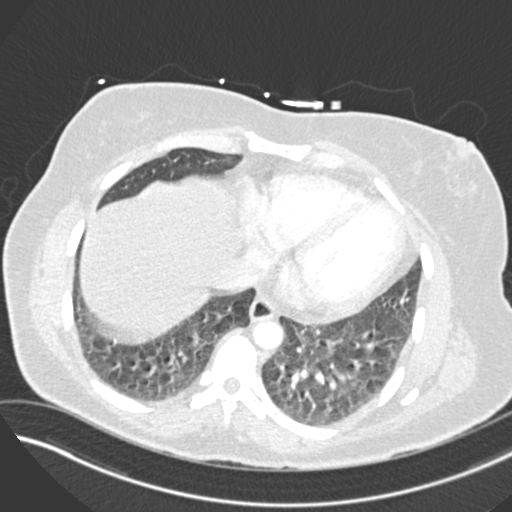
[im 117/278  mediastinal]
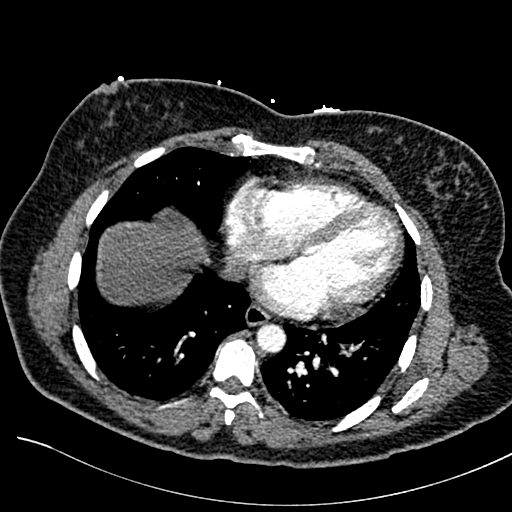
[im 132/278  lung]
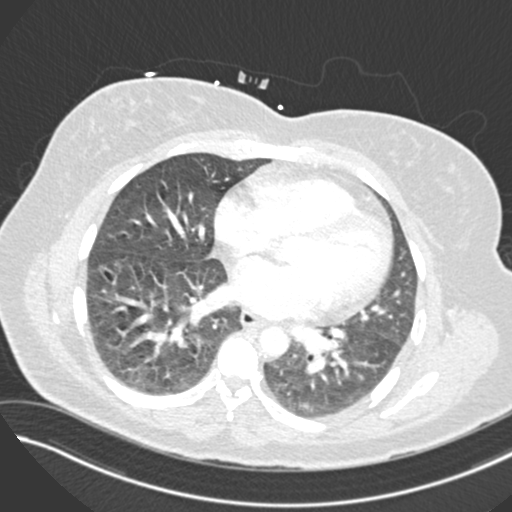
[im 146/278  mediastinal]
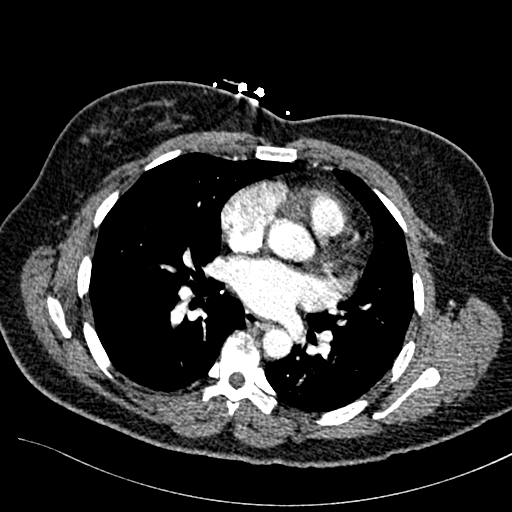
[im 161/278  lung]
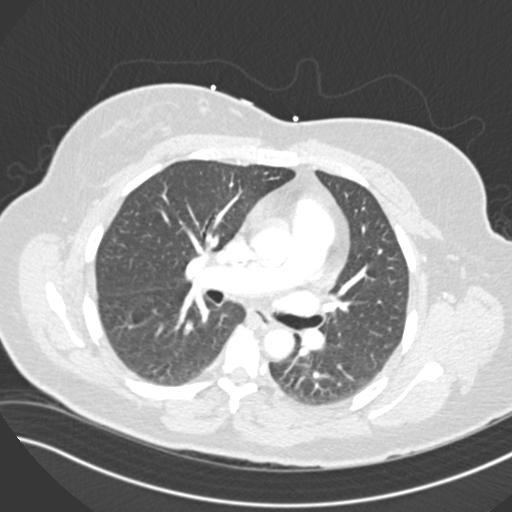
[im 175/278  mediastinal]
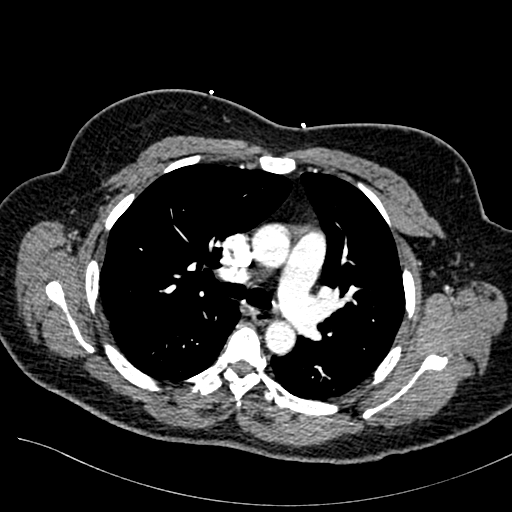
[im 190/278  lung]
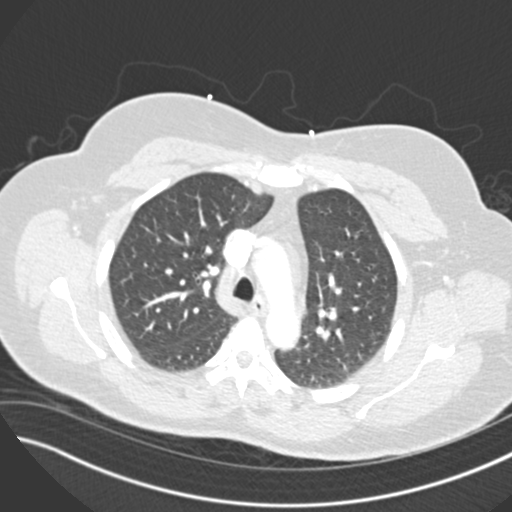
[im 205/278  mediastinal]
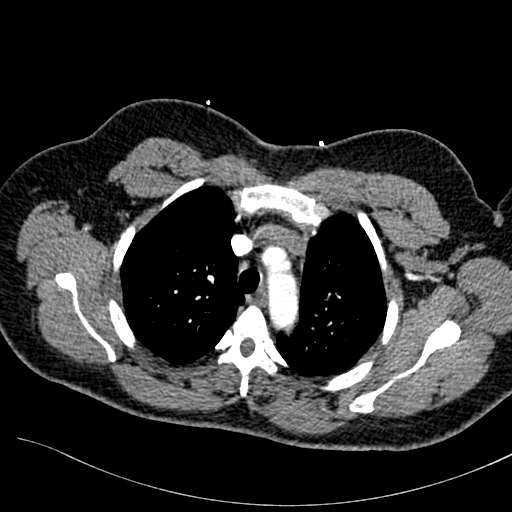
[im 219/278  lung]
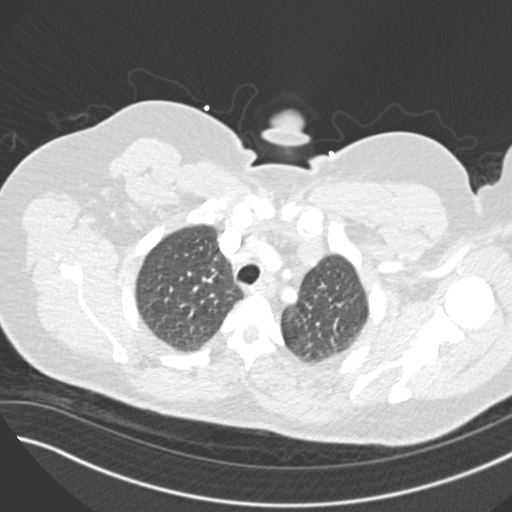
[im 234/278  mediastinal]
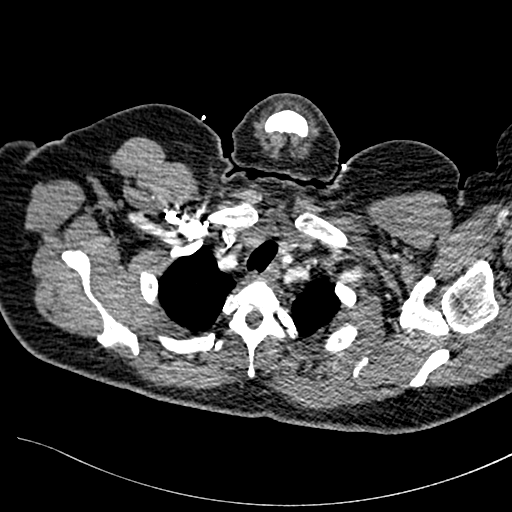
[im 248/278  lung]
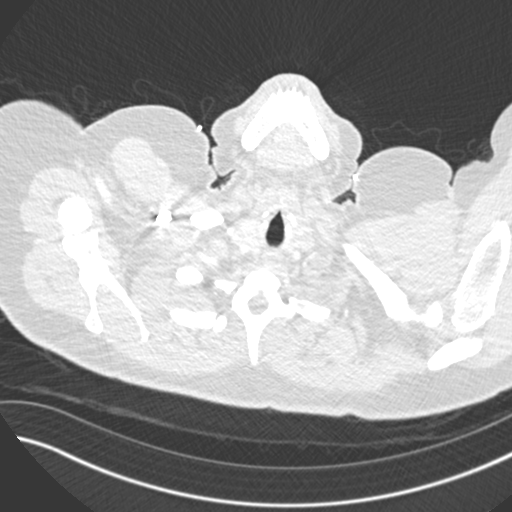
[im 263/278  mediastinal]
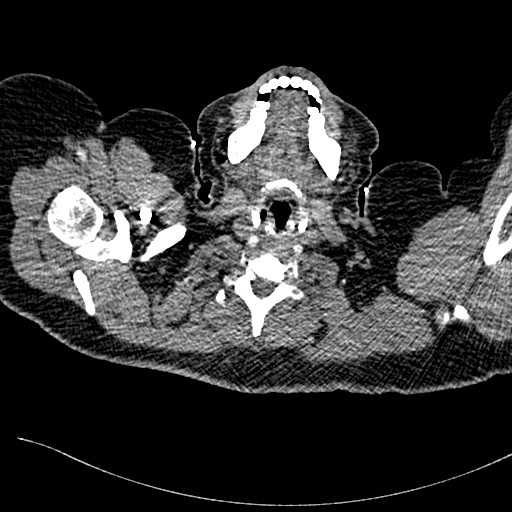

[19 of 36 positions shown; findings below may reference images not displayed]

FINDINGS: Cardiovascular: There is adequate opacification of the central
pulmonary arteries. No intraluminal filling defect identified
through the segmental level to suggest acute pulmonary embolism. The
subsegmental pulmonary arterial tree is not well opacified on this
examination. The central pulmonary arteries are of normal caliber.
Cardiac size within normal limits. No significant coronary artery
calcification. No pericardial effusion. The thoracic aorta is
unremarkable.

Mediastinum/Nodes: No enlarged mediastinal, hilar, or axillary lymph
nodes. Thyroid gland, trachea, and esophagus demonstrate no
significant findings.

Lungs/Pleura: Lungs are clear. No pleural effusion or pneumothorax.

Upper Abdomen: Severe hepatic steatosis.

Musculoskeletal: No chest wall abnormality. No acute or significant
osseous findings.

Review of the MIP images confirms the above findings.
IMPRESSION: No evidence of pulmonary embolism through the segmental level. No
acute intrathoracic process identified.

Severe hepatic steatosis.
